# Patient Record
Sex: Male | Born: 1980 | Race: White | Hispanic: No | Marital: Single | Smoking: Current every day smoker
Health system: Southern US, Community
[De-identification: ages and names within clinical notes are randomized; demographics above are authoritative.]

---

## 2000-07-08 ENCOUNTER — Emergency Department (HOSPITAL_COMMUNITY): Admission: EM | Admit: 2000-07-08 | Discharge: 2000-07-08 | Payer: Self-pay | Admitting: Emergency Medicine

## 2000-10-05 ENCOUNTER — Emergency Department (HOSPITAL_COMMUNITY): Admission: EM | Admit: 2000-10-05 | Discharge: 2000-10-05 | Payer: Self-pay | Admitting: Emergency Medicine

## 2001-01-20 ENCOUNTER — Emergency Department (HOSPITAL_COMMUNITY): Admission: EM | Admit: 2001-01-20 | Discharge: 2001-01-20 | Payer: Self-pay | Admitting: Emergency Medicine

## 2002-10-23 ENCOUNTER — Emergency Department (HOSPITAL_COMMUNITY): Admission: EM | Admit: 2002-10-23 | Discharge: 2002-10-23 | Payer: Self-pay | Admitting: Emergency Medicine

## 2003-03-27 ENCOUNTER — Emergency Department (HOSPITAL_COMMUNITY): Admission: EM | Admit: 2003-03-27 | Discharge: 2003-03-27 | Payer: Self-pay | Admitting: Emergency Medicine

## 2003-04-08 ENCOUNTER — Emergency Department (HOSPITAL_COMMUNITY): Admission: EM | Admit: 2003-04-08 | Discharge: 2003-04-08 | Payer: Self-pay | Admitting: Emergency Medicine

## 2003-07-20 ENCOUNTER — Emergency Department (HOSPITAL_COMMUNITY): Admission: EM | Admit: 2003-07-20 | Discharge: 2003-07-20 | Payer: Self-pay | Admitting: Emergency Medicine

## 2003-10-31 ENCOUNTER — Emergency Department (HOSPITAL_COMMUNITY): Admission: EM | Admit: 2003-10-31 | Discharge: 2003-10-31 | Payer: Self-pay | Admitting: Emergency Medicine

## 2004-11-23 ENCOUNTER — Emergency Department (HOSPITAL_COMMUNITY): Admission: EM | Admit: 2004-11-23 | Discharge: 2004-11-23 | Payer: Self-pay | Admitting: Emergency Medicine

## 2006-02-14 ENCOUNTER — Emergency Department (HOSPITAL_COMMUNITY): Admission: EM | Admit: 2006-02-14 | Discharge: 2006-02-14 | Payer: Self-pay | Admitting: Emergency Medicine

## 2006-08-14 ENCOUNTER — Emergency Department (HOSPITAL_COMMUNITY): Admission: EM | Admit: 2006-08-14 | Discharge: 2006-08-14 | Payer: Self-pay | Admitting: Emergency Medicine

## 2006-11-27 ENCOUNTER — Emergency Department (HOSPITAL_COMMUNITY): Admission: EM | Admit: 2006-11-27 | Discharge: 2006-11-27 | Payer: Self-pay | Admitting: Emergency Medicine

## 2008-10-07 ENCOUNTER — Emergency Department (HOSPITAL_COMMUNITY): Admission: EM | Admit: 2008-10-07 | Discharge: 2008-10-07 | Payer: Self-pay | Admitting: Emergency Medicine

## 2009-10-07 ENCOUNTER — Emergency Department (HOSPITAL_COMMUNITY): Admission: EM | Admit: 2009-10-07 | Discharge: 2009-10-07 | Payer: Self-pay | Admitting: Emergency Medicine

## 2010-11-28 LAB — URINALYSIS, ROUTINE W REFLEX MICROSCOPIC
Bilirubin Urine: NEGATIVE
Glucose, UA: NEGATIVE mg/dL
Hgb urine dipstick: NEGATIVE
Ketones, ur: NEGATIVE mg/dL
Nitrite: NEGATIVE
Protein, ur: NEGATIVE mg/dL
Specific Gravity, Urine: 1.024 (ref 1.005–1.030)
Urobilinogen, UA: 1 mg/dL (ref 0.0–1.0)
pH: 7.5 (ref 5.0–8.0)

## 2010-11-28 LAB — URINE MICROSCOPIC-ADD ON

## 2019-02-02 ENCOUNTER — Encounter (HOSPITAL_COMMUNITY): Payer: Self-pay | Admitting: Emergency Medicine

## 2019-02-02 ENCOUNTER — Emergency Department (HOSPITAL_COMMUNITY)
Admission: EM | Admit: 2019-02-02 | Discharge: 2019-02-02 | Disposition: A | Discharge status: Home / Self Care | Payer: BLUE CROSS/BLUE SHIELD | Attending: Emergency Medicine | Admitting: Emergency Medicine

## 2019-02-02 ENCOUNTER — Other Ambulatory Visit: Payer: Self-pay

## 2019-02-02 DIAGNOSIS — F17200 Nicotine dependence, unspecified, uncomplicated: Secondary | ICD-10-CM | POA: Diagnosis not present

## 2019-02-02 DIAGNOSIS — L0211 Cutaneous abscess of neck: Secondary | ICD-10-CM | POA: Insufficient documentation

## 2019-02-02 DIAGNOSIS — L0291 Cutaneous abscess, unspecified: Secondary | ICD-10-CM

## 2019-02-02 LAB — CBG MONITORING, ED: Glucose-Capillary: 105 mg/dL — ABNORMAL HIGH (ref 70–99)

## 2019-02-02 MED ORDER — LIDOCAINE-EPINEPHRINE (PF) 2 %-1:200000 IJ SOLN
20.0000 mL | Freq: Once | INTRAMUSCULAR | Status: AC
  Administered 2019-02-02: 20 mL
  Filled 2019-02-02: qty 20

## 2019-02-02 MED ORDER — ACETAMINOPHEN 500 MG PO TABS: 1000.0000 mg | ORAL_TABLET | Freq: Once | ORAL | Status: DC

## 2019-02-02 MED ORDER — ACETAMINOPHEN 325 MG PO TABS
ORAL_TABLET | ORAL | Status: AC
  Filled 2019-02-02: qty 2

## 2019-02-02 MED ORDER — CEPHALEXIN 500 MG PO CAPS: 500.0000 mg | ORAL_CAPSULE | Freq: Four times a day (QID) | ORAL | 0 refills | Status: DC

## 2019-02-02 MED ORDER — ACETAMINOPHEN 325 MG PO TABS
650.0000 mg | ORAL_TABLET | Freq: Once | ORAL | Status: AC
  Administered 2019-02-02: 650 mg via ORAL

## 2019-02-02 MED ORDER — LIDOCAINE HCL (PF) 2 % IJ SOLN
INTRAMUSCULAR | Status: AC
  Filled 2019-02-02: qty 10

## 2019-02-02 MED ORDER — POVIDONE-IODINE 10 % EX SOLN
CUTANEOUS | Status: AC
  Administered 2019-02-02: 16:00:00
  Filled 2019-02-02: qty 30

## 2019-02-02 NOTE — ED Triage Notes (Signed)
Pt c/o of abscess on the back of his neck with swelling x 1 week

## 2019-02-02 NOTE — ED Provider Notes (Signed)
[facility] EMERGENCY DEPARTMENT Provider Note   CSN: [id number] Arrival date & time: 02/02/19  1529    History   Chief Complaint Chief Complaint  Patient presents with  . Abscess    HPI Joseph Ball is a 38 y.o. male.  He has no significant past medical history.  He is complaining of pain and swelling to the back of his neck that is been going on about 4 days getting progressively worse.  He thought it was an ingrown hair but seems to be getting much worse.  He said it drained a little bit of pus but he has not done anything to it.  Denies fevers.  Denies any history of diabetes.  No prior history of any abscesses.     The history is provided by the patient.  Abscess  Location:  Head/neck Head/neck abscess location: posterior neck. Size:  6 Abscess quality: induration, painful, redness and warmth   Abscess quality: not draining   Red streaking: no   Duration:  4 days Progression:  Worsening Pain details:    Quality:  Throbbing   Severity:  Severe   Timing:  Constant   Progression:  Worsening Chronicity:  New Context: not diabetes, not immunosuppression, not injected drug use, not insect bite/sting and not skin injury   Relieved by:  Nothing Worsened by:  Draining/squeezing Ineffective treatments:  None tried Associated symptoms: no fever, no nausea and no vomiting   Risk factors: no hx of MRSA and no prior abscess     History reviewed. No pertinent past medical history.  There are no active problems to display for this patient.   History reviewed. No pertinent surgical history.      Home Medications    Prior to Admission medications   Not on File    Family History History reviewed. No pertinent family history.  Social History Social History   Tobacco Use  . Smoking status: Current Every Day Smoker    Packs/day: 0.50  . Smokeless tobacco: Never Used  Substance Use Topics  . Alcohol use: Yes  . Drug use: Never     Allergies   Mushroom  extract complex and Shellfish allergy   Review of Systems Review of Systems  Constitutional: Negative for fever.  HENT: Negative for sore throat.   Respiratory: Negative for cough.   Gastrointestinal: Negative for nausea and vomiting.  Musculoskeletal: Positive for neck pain.  Skin: Positive for rash.  Neurological: Negative for speech difficulty.     Physical Exam Updated Vital Signs BP 132/84 (BP Location: Right Arm)   Pulse 96   Temp 98.2 F (36.8 C) (Oral)   Resp 18   Ht 6' (1.829 m)   Wt 113.4 kg   SpO2 99%   BMI 33.91 kg/m   Physical Exam Vitals signs and nursing note reviewed.  Constitutional:      Appearance: He is well-developed.  HENT:     Head: Normocephalic and atraumatic.  Eyes:     Conjunctiva/sclera: Conjunctivae normal.  Neck:     Musculoskeletal: Normal range of motion.     Comments: He is approximately 6 x 6 cm area of induration and tenderness with overlying erythema and a few small pustules on the posterior part of his neck at his hairline. Pulmonary:     Effort: Pulmonary effort is normal.  Skin:    General: Skin is warm and dry.     Capillary Refill: Capillary refill takes less than 2 seconds.     Findings:  Erythema and rash present.  Neurological:     General: No focal deficit present.     Mental Status: He is alert.     GCS: GCS eye subscore is 4. GCS verbal subscore is 5. GCS motor subscore is 6.      ED Treatments / Results  Labs (all labs ordered are listed, but only abnormal results are displayed) Labs Reviewed - No data to display  EKG None  Radiology No results found.  Procedures .Marland KitchenIncision and Drainage Date/Time: 02/02/2019 4:21 PM Performed by: Terrilee Files, MD Authorized by: Terrilee Files, MD   Consent:    Consent obtained:  Verbal   Consent given by:  Patient   Risks discussed:  Bleeding, incomplete drainage, pain and infection   Alternatives discussed:  No treatment, delayed treatment and referral  Location:    Type:  Abscess   Size:  6   Location:  Neck   Neck location:  R posterior Pre-procedure details:    Skin preparation:  Betadine Anesthesia (see MAR for exact dosages):    Anesthesia method:  Local infiltration   Local anesthetic:  Lidocaine 2% WITH epi Procedure type:    Complexity:  Simple Procedure details:    Incision types:  Single straight   Incision depth:  Subcutaneous   Scalpel blade:  15   Wound management:  Probed and deloculated   Drainage:  Purulent   Drainage amount:  Scant   Packing materials:  1/2 in iodoform gauze   Amount 1/2" iodoform:  6 Post-procedure details:    Patient tolerance of procedure:  Tolerated well, no immediate complications   (including critical care time)  Medications Ordered in ED Medications  lidocaine-EPINEPHrine (XYLOCAINE W/EPI) 2 %-1:200000 (PF) injection 20 mL (has no administration in time range)     Initial Impression / Assessment and Plan / ED Course  I have reviewed the triage vital signs and the nursing notes.  Pertinent labs & imaging results that were available during my care of the patient were reviewed by me and considered in my medical decision making (see chart for details).  Clinical Course as of Feb 02 1620  Sat Feb 02, 2019  [id number] 38 year old male with no stated past medical history history here with cellulitis/abscess on the back of his neck.  He has not had anything like this before.  Checked a fingerstick just to make sure he was in a new diabetic and it was only 105.  He is agreeable to an I&D of the area.   [MB]    Clinical Course User Index [MB] Terrilee Files, MD       Final Clinical Impressions(s) / ED Diagnoses   Final diagnoses:  Abscess    ED Discharge Orders         Ordered    cephALEXin (KEFLEX) 500 MG capsule  4 times daily     02/02/19 1620           Terrilee Files, MD 02/03/19 1219

## 2019-02-02 NOTE — Discharge Instructions (Addendum)
You were evaluated in the emergency department for swelling and pain in the back of your neck.  There was a abscess with a lot of induration that required incision and drainage.  We placed a packing that will need to be removed in 1 to 2 days.  The area will likely bleed a little bit and so you should keep it covered.  We are putting you on antibiotics for 7 days.  You should use Tylenol and ibuprofen for the pain.  Please return if any worsening symptoms.

## 2021-02-28 ENCOUNTER — Other Ambulatory Visit: Payer: Self-pay

## 2021-02-28 ENCOUNTER — Encounter (HOSPITAL_BASED_OUTPATIENT_CLINIC_OR_DEPARTMENT_OTHER): Payer: Self-pay | Admitting: Emergency Medicine

## 2021-02-28 ENCOUNTER — Emergency Department (HOSPITAL_BASED_OUTPATIENT_CLINIC_OR_DEPARTMENT_OTHER)
Admission: EM | Admit: 2021-02-28 | Discharge: 2021-02-28 | Disposition: A | Discharge status: Home / Self Care | Payer: BLUE CROSS/BLUE SHIELD | Attending: Emergency Medicine | Admitting: Emergency Medicine

## 2021-02-28 DIAGNOSIS — F17210 Nicotine dependence, cigarettes, uncomplicated: Secondary | ICD-10-CM | POA: Insufficient documentation

## 2021-02-28 DIAGNOSIS — L0291 Cutaneous abscess, unspecified: Secondary | ICD-10-CM

## 2021-02-28 DIAGNOSIS — L02213 Cutaneous abscess of chest wall: Secondary | ICD-10-CM | POA: Insufficient documentation

## 2021-02-28 MED ORDER — ACETAMINOPHEN 325 MG PO TABS
650.0000 mg | ORAL_TABLET | Freq: Once | ORAL | Status: AC
  Administered 2021-02-28: 650 mg via ORAL
  Filled 2021-02-28: qty 2

## 2021-02-28 MED ORDER — LIDOCAINE-EPINEPHRINE (PF) 2 %-1:200000 IJ SOLN
10.0000 mL | Freq: Once | INTRAMUSCULAR | Status: AC
  Administered 2021-02-28: 10 mL
  Filled 2021-02-28: qty 20

## 2021-02-28 MED ORDER — DOXYCYCLINE HYCLATE 100 MG PO CAPS: 100.0000 mg | ORAL_CAPSULE | Freq: Two times a day (BID) | ORAL | 0 refills | Status: AC

## 2021-02-28 MED ORDER — DOXYCYCLINE HYCLATE 100 MG PO TABS
100.0000 mg | ORAL_TABLET | Freq: Once | ORAL | Status: AC
  Administered 2021-02-28: 100 mg via ORAL
  Filled 2021-02-28: qty 1

## 2021-02-28 NOTE — Discharge Instructions (Addendum)
Please do warm compresses to your chest abscess and take antibiotics for the entire course.  Follow-up with your primary care provider in 2 days or to return the ER for reevaluation.  Please use Tylenol or ibuprofen for pain.  You may use 600 mg ibuprofen every 6 hours or 1000 mg of Tylenol every 6 hours.  You may choose to alternate between the 2.  This would be most effective.  Not to exceed 4 g of Tylenol within 24 hours.  Not to exceed 3200 mg ibuprofen 24 hours.

## 2021-02-28 NOTE — ED Triage Notes (Signed)
Pt arrives pov, ambulatory to triage, c/o abscess on L chest x 3 days. Redness and swelling noted. Pt denies fever.

## 2021-02-28 NOTE — ED Provider Notes (Signed)
[facility] EMERGENCY DEPARTMENT Provider Note   CSN: [id number] Arrival date & time: 02/28/21  1148     History Chief Complaint  Patient presents with   Abscess    Joseph Ball is a 40 y.o. male.  HPI Patient is 40 year old male presenting today with left-sided chest area of swelling warmth redness and tenderness to touch.  He states that it has been present for the past 3 days he states it has been progressively worsening denies any fevers chills nausea vomiting or systemic symptoms.  He states that it is achy and painful to touch.  Warm and tender.  Denies any other associate symptoms.  No aggravating mitigating factors.  No medications prior to arrival.  No other associated symptoms      History reviewed. No pertinent past medical history.  There are no problems to display for this patient.   History reviewed. No pertinent surgical history.     History reviewed. No pertinent family history.  Social History   Tobacco Use   Smoking status: Every Day    Packs/day: 0.50    Pack years: 0.00    Types: Cigarettes   Smokeless tobacco: Never  Substance Use Topics   Alcohol use: Not Currently   Drug use: Never    Home Medications Prior to Admission medications   Medication Sig Start Date End Date Taking? Authorizing Provider  doxycycline (VIBRAMYCIN) 100 MG capsule Take 1 capsule (100 mg total) by mouth 2 (two) times daily for 7 days. 02/28/21 03/07/21 Yes Zameria Vogl S, PA  cephALEXin (KEFLEX) 500 MG capsule Take 1 capsule (500 mg total) by mouth 4 (four) times daily. 02/02/19   Terrilee Files, MD    Allergies    Mushroom extract complex and Shellfish allergy  Review of Systems   Review of Systems  Constitutional:  Negative for fever.  HENT:  Negative for congestion.   Respiratory:  Negative for shortness of breath.   Cardiovascular:  Negative for chest pain.  Gastrointestinal:  Negative for abdominal distention.  Skin:  Positive for wound.        Abscess  Neurological:  Negative for dizziness and headaches.   Physical Exam Updated Vital Signs BP 113/72 (BP Location: Right Arm)   Pulse 67   Temp 98.1 F (36.7 C) (Oral)   Resp 16   Ht 6' (1.829 m)   Wt 122.5 kg   SpO2 100%   BMI 36.62 kg/m   Physical Exam Vitals and nursing note reviewed.  Constitutional:      General: He is not in acute distress.    Appearance: Normal appearance. He is not ill-appearing.  HENT:     Head: Normocephalic and atraumatic.  Eyes:     General: No scleral icterus.       Right eye: No discharge.        Left eye: No discharge.     Conjunctiva/sclera: Conjunctivae normal.  Pulmonary:     Effort: Pulmonary effort is normal.     Breath sounds: No stridor.  Skin:    General: Skin is warm and dry.     Comments: Patient with approximately 4 x 3 cm red indurated area with a head with no evidence of purulent drainage.  Bedside ultrasound confirmed fluid collection underneath.  Neurological:     Mental Status: He is alert and oriented to person, place, and time. Mental status is at baseline.    ED Results / Procedures / Treatments   Labs (all labs ordered  are listed, but only abnormal results are displayed) Labs Reviewed - No data to display  EKG None  Radiology No results found.  Procedures .Marland KitchenIncision and Drainage  Date/Time: 02/28/2021 2:54 PM Performed by: Gailen Shelter, PA Authorized by: Gailen Shelter, PA   Consent:    Consent obtained:  Verbal   Consent given by:  Patient   Risks discussed:  Bleeding, incomplete drainage, pain and damage to other organs   Alternatives discussed:  No treatment Universal protocol:    Procedure explained and questions answered to patient or proxy's satisfaction: yes     Relevant documents present and verified: yes     Test results available : yes     Imaging studies available: yes     Required blood products, implants, devices, and special equipment available: yes     Site/side marked:  yes     Immediately prior to procedure, a time out was called: yes     Patient identity confirmed:  Verbally with patient Location:    Type:  Abscess   Size:  4x3 cm   Location:  Trunk   Trunk location:  L breast Pre-procedure details:    Skin preparation:  Chlorhexidine Anesthesia:    Anesthesia method:  Local infiltration   Local anesthetic:  Lidocaine 2% WITH epi Procedure type:    Complexity:  Complex Procedure details:    Ultrasound guidance: yes     Incision types:  Cruciate   Incision depth:  Subcutaneous   Wound management:  Probed and deloculated, irrigated with saline and extensive cleaning   Drainage:  Purulent   Drainage amount:  Moderate Post-procedure details:    Procedure completion:  Tolerated well, no immediate complications   Medications Ordered in ED Medications  lidocaine-EPINEPHrine (XYLOCAINE W/EPI) 2 %-1:200000 (PF) injection 10 mL (10 mLs Infiltration Given by Other 02/28/21 1415)  acetaminophen (TYLENOL) tablet 650 mg (650 mg Oral Given 02/28/21 1414)  doxycycline (VIBRA-TABS) tablet 100 mg (100 mg Oral Given 02/28/21 1414)    ED Course  I have reviewed the triage vital signs and the nursing notes.  Pertinent labs & imaging results that were available during my care of the patient were reviewed by me and considered in my medical decision making (see chart for details).    MDM Rules/Calculators/A&P                          Patient with left chest wall abscess.  Incised and drained.  Field block was successful.  Tylenol and first dose of doxycycline given here in the ER.  Return precautions given we will follow-up in 2 days with urgent care, PCP or return to ER.  No systemic symptoms.  Abscess pocket not large enough to require packing. Moderate copious fluid was expressed.  Patient tolerated procedure well.  Final Clinical Impression(s) / ED Diagnoses Final diagnoses:  Abscess  Chest wall abscess    Rx / DC Orders ED Discharge Orders           Ordered    doxycycline (VIBRAMYCIN) 100 MG capsule  2 times daily        02/28/21 1451             Solon Augusta Parkman, [place] 02/28/21 1456    Gwyneth Sprout, MD 03/01/21 1455

## 2021-05-27 ENCOUNTER — Telehealth: Payer: Self-pay | Admitting: Physician Assistant

## 2021-05-27 DIAGNOSIS — K047 Periapical abscess without sinus: Secondary | ICD-10-CM

## 2021-05-27 MED ORDER — AMOXICILLIN-POT CLAVULANATE 875-125 MG PO TABS: 1.0000 | ORAL_TABLET | Freq: Two times a day (BID) | ORAL | 0 refills | Status: AC

## 2021-05-27 NOTE — Progress Notes (Signed)
Patient no-showed after 15 minutes of provider waiting in waiting room. Repeat links sent to email/number listed.

## 2021-05-27 NOTE — Progress Notes (Signed)
Virtual Visit Consent   Joseph Ball, you are scheduled for a virtual visit with a  provider today.     Just as with appointments in the office, your consent must be obtained to participate.  Your consent will be active for this visit and any virtual visit you may have with one of our providers in the next 365 days.     If you have a MyChart account, a copy of this consent can be sent to you electronically.  All virtual visits are billed to your insurance company just like a traditional visit in the office.    As this is a virtual visit, video technology does not allow for your provider to perform a traditional examination.  This may limit your provider's ability to fully assess your condition.  If your provider identifies any concerns that need to be evaluated in person or the need to arrange testing (such as labs, EKG, etc.), we will make arrangements to do so.     Although advances in technology are sophisticated, we cannot ensure that it will always work on either your end or our end.  If the connection with a video visit is poor, the visit may have to be switched to a telephone visit.  With either a video or telephone visit, we are not always able to ensure that we have a secure connection.     I need to obtain your verbal consent now.   Are you willing to proceed with your visit today?    Joseph Ball has provided verbal consent on 05/27/2021 for a virtual visit (video or telephone).   Piedad Climes, [place]   Date: 05/27/2021 5:27 PM   Virtual Visit via Video Note   I, Piedad Climes, connected with  Joseph Ball  ([id number], 01/30/1977) on 05/27/21 at  5:15 PM EDT by a video-enabled telemedicine application and verified that I am speaking with the correct person using two identifiers.  Location: Patient: Virtual Visit Location Patient: Home Provider: Virtual Visit Location Provider: Home Office   I discussed the limitations of evaluation and management by  telemedicine and the availability of in person appointments. The patient expressed understanding and agreed to proceed.    History of Present Illness: Joseph Ball is a 40 y.o. who identifies as a male who was assigned male at birth, and is being seen today for 1 day of upper R tooth /gum swelling with subsequent cheek swelling in that area. Denies fever, chills. Pain is mild-moderate. Denies decreased ROM of jaw. Chipped a tooth in this area recently adjacent to area of gingival swelling. Is without insurance (Medicaid pending) and does not have PCP or dental care. Marland Kitchen  HPI: HPI  Problems: There are no problems to display for this patient.   Allergies:  Allergies  Allergen Reactions   Fish Allergy Anaphylaxis   Shellfish Allergy Anaphylaxis   Mushroom Extract Complex     swelling   Medications:  Current Outpatient Medications:    amoxicillin-clavulanate (AUGMENTIN) 875-125 MG tablet, Take 1 tablet by mouth 2 (two) times daily., Disp: 14 tablet, Rfl: 0  Observations/Objective: Patient is well-developed, well-nourished in no acute distress.  Resting comfortably at home.  Head is normocephalic, atraumatic.  No labored breathing. Speech is clear and coherent with logical content.  Patient is alert and oriented at baseline.  R facial swelling -- mild, just overlying zygomatic bone without significant tenderness and without induration with patient palpation.  Can open mouth  Assessment and Plan: 1.  Dental abscess - amoxicillin-clavulanate (AUGMENTIN) 875-125 MG tablet; Take 1 tablet by mouth 2 (two) times daily.  Dispense: 14 tablet; Refill: 0 Afebrile. Normal ROM of jaw. Recent onset with history of chipped tooth. Supportive measures and OTC meds discussed. Rx Augmentin. Strict UC/ER precautions reviewed. Local resources reviewed.   Follow Up Instructions: I discussed the assessment and treatment plan with the patient. The patient was provided an opportunity to ask questions and all were  answered. The patient agreed with the plan and demonstrated an understanding of the instructions.  A copy of instructions were sent to the patient via MyChart.  The patient was advised to call back or seek an in-person evaluation if the symptoms worsen or if the condition fails to improve as anticipated.  Time:  I spent 15 minutes with the patient via telehealth technology discussing the above problems/concerns.    Piedad Climes, PA-C

## 2021-06-08 ENCOUNTER — Ambulatory Visit (HOSPITAL_COMMUNITY)
Admission: EM | Admit: 2021-06-08 | Discharge: 2021-06-08 | Disposition: A | Discharge status: Home / Self Care | Payer: Self-pay | Attending: Student | Admitting: Student

## 2021-06-08 ENCOUNTER — Other Ambulatory Visit: Payer: Self-pay

## 2021-06-08 ENCOUNTER — Ambulatory Visit (HOSPITAL_COMMUNITY): Admission: EM | Admit: 2021-06-08 | Payer: Self-pay | Source: Home / Self Care

## 2021-06-08 ENCOUNTER — Ambulatory Visit (INDEPENDENT_AMBULATORY_CARE_PROVIDER_SITE_OTHER): Payer: Self-pay

## 2021-06-08 ENCOUNTER — Encounter (HOSPITAL_COMMUNITY): Payer: Self-pay | Admitting: Emergency Medicine

## 2021-06-08 DIAGNOSIS — M79641 Pain in right hand: Secondary | ICD-10-CM

## 2021-06-08 DIAGNOSIS — W540XXA Bitten by dog, initial encounter: Secondary | ICD-10-CM

## 2021-06-08 MED ORDER — AMOXICILLIN-POT CLAVULANATE 875-125 MG PO TABS: 1.0000 | ORAL_TABLET | Freq: Two times a day (BID) | ORAL | 0 refills | Status: AC

## 2021-06-08 NOTE — Discharge Instructions (Addendum)
-  Start the antibiotic-Augmentin (amoxicillin-clavulanate), 1 pill every 12 hours for 7 days.  You can take this with food like with breakfast and dinner. -Wash your wound with gentle soap and water 1-2 times daily.  Let air dry or gently pat. You can follow with over-the-counter neosporin ointment (or similar). Keep wrapped during the day or when you're doing something that could get it dirty (working, [employer], cooking, Catering manager). Avoid cleansing with hydrogen peroxide or alcohol!! -Seek additional medical attention if the wound is getting worse instead of better- redness increasing in size, pain getting worse, new/worsening discharge, new fevers/chills, etc. -If symptoms persist in 3 days, follow-up with an orthopedist. I recommend [facility] at [address] [place], [place] [address]. You can schedule an appointment by calling [phone]) or online ([email or web address]), but they also have a walk-in clinic M-F 8a-8p and Sat 10a-3p.

## 2021-06-08 NOTE — ED Triage Notes (Signed)
Pt has a dog bite to his right hand x 4 days. Pt was trying to prevent his dog from attacking another dog. EMS dressed and assessed wound at the time of incident.

## 2021-06-08 NOTE — ED Provider Notes (Signed)
[facility]    CSN: [id number] Arrival date & time: 06/08/21  0947      History   Chief Complaint Chief Complaint  Patient presents with   Animal Bite    Right hand, Pt is up to date with tetanus. His dog has up to date vaccines    HPI DAYMON HORA is a 40 y.o. male presenting with dog bite right hand sustained 4 days ago.  Medical history noncontributory.  This patient states that he was at a fair when another dog lunged at his dog, he jumped onto his own dog to prevent the other dog from biting it, his own dog latched onto his right hand.  Patient states that it was a soft bite, EMTs evaluated at the scene and said it was a superficial wound.  He is presenting today because he is concerned that his hand is "broken".  Requesting an x-ray of the right hand as he is having difficulty moving his third and fourth fingers and pain at the MCP joint.  States he been cleaning the lacerations with alcohol and they seem to be healing well, does not think he needs an antibiotic.  Some tingling over dorsum of the third and fourth MCP bones. He is right handed.   HPI  No past medical history on file.  There are no problems to display for this patient.   History reviewed. No pertinent surgical history.     Home Medications    Prior to Admission medications   Medication Sig Start Date End Date Taking? Authorizing Provider  amoxicillin-clavulanate (AUGMENTIN) 875-125 MG tablet Take 1 tablet by mouth every 12 (twelve) hours. 06/08/21  Yes Rhys Martini, PA-C    Family History History reviewed. No pertinent family history.  Social History Social History   Tobacco Use   Smoking status: Every Day    Packs/day: 0.50    Types: Cigarettes   Smokeless tobacco: Never  Vaping Use   Vaping Use: Every day  Substance Use Topics   Alcohol use: Not Currently   Drug use: Never     Allergies   Mushroom extract complex and Shellfish allergy   Review of Systems Review of  Systems  Musculoskeletal:        R hand pain  All other systems reviewed and are negative.   Physical Exam Triage Vital Signs ED Triage Vitals  Enc Vitals Group     BP 06/08/21 1032 113/79     Pulse Rate 06/08/21 1032 60     Resp --      Temp 06/08/21 1032 98.2 F (36.8 C)     Temp Source 06/08/21 1032 Oral     SpO2 06/08/21 1032 96 %     Weight --      Height --      Head Circumference --      Peak Flow --      Pain Score 06/08/21 1033 10     Pain Loc --      Pain Edu? --      Excl. in GC? --    No data found.  Updated Vital Signs BP 113/79 (BP Location: Left Arm)   Pulse 60   Temp 98.2 F (36.8 C) (Oral)   SpO2 96%   Visual Acuity Right Eye Distance:   Left Eye Distance:   Bilateral Distance:    Right Eye Near:   Left Eye Near:    Bilateral Near:     Physical Exam Vitals  reviewed.  Constitutional:      General: He is not in acute distress.    Appearance: Normal appearance. He is not ill-appearing or diaphoretic.  HENT:     Head: Normocephalic and atraumatic.  Cardiovascular:     Rate and Rhythm: Normal rate and regular rhythm.     Heart sounds: Normal heart sounds.  Pulmonary:     Effort: Pulmonary effort is normal.     Breath sounds: Normal breath sounds.  Musculoskeletal:     Comments: See images below. Few well healed lacerations dorsum R hand. ROM 3rd and 4th fingers decreased and tender over MCP joints. Sensation intact. Aside from lacerations no obvious skin changes effusion or bony deformity. Grip strength 3/5, sensation intact, cap refill < 2 seconds, radial pulse 2+. No other lacerations or injuries.  Skin:    General: Skin is warm.  Neurological:     General: No focal deficit present.     Mental Status: He is alert and oriented to person, place, and time.  Psychiatric:        Mood and Affect: Mood normal.        Behavior: Behavior normal.        Thought Content: Thought content normal.        Judgment: Judgment normal.            UC Treatments / Results  Labs (all labs ordered are listed, but only abnormal results are displayed) Labs Reviewed - No data to display  EKG   Radiology DG Hand Complete Right  Result Date: 06/08/2021 CLINICAL DATA:  Right hand pain after dog bite. EXAM: RIGHT HAND - COMPLETE 3+ VIEW COMPARISON:  None. FINDINGS: There is no evidence of fracture or dislocation. There is no evidence of arthropathy or other focal bone abnormality. Soft tissues are unremarkable. IMPRESSION: Negative. Electronically Signed   By: Lupita Raider M.D.   On: 06/08/2021 11:58    Procedures Procedures (including critical care time)  Medications Ordered in UC Medications - No data to display  Initial Impression / Assessment and Plan / UC Course  I have reviewed the triage vital signs and the nursing notes.  Pertinent labs & imaging results that were available during my care of the patient were reviewed by me and considered in my medical decision making (see chart for details).     This patient is a very pleasant 40 y.o. year old male presenting with R hand contusion following dog bite. Neurovascularly intact.   Xray R hand negative. Tdap UTD 2020 per pt.  Dog was pt's own and UTD on vaccines.  Augmentin sent as below.   Wrist brace provided at patient request.  Discussed that for pain management, he should start with Tylenol and ibuprofen, as he has not tried any medications for the symptoms. He is in agreement.  F/u with [facility] if symptoms persist. ED return precautions discussed. Patient verbalizes understanding and agreement.   Final Clinical Impressions(s) / UC Diagnoses   Final diagnoses:  Right hand pain  Dog bite, initial encounter     Discharge Instructions      -Start the antibiotic-Augmentin (amoxicillin-clavulanate), 1 pill every 12 hours for 7 days.  You can take this with food like with breakfast and dinner. -Wash your wound with gentle soap and water 1-2  times daily.  Let air dry or gently pat. You can follow with over-the-counter neosporin ointment (or similar). Keep wrapped during the day or when you're doing something that could get it  dirty (working, Presenter, broadcasting, cooking, Catering manager). Avoid cleansing with hydrogen peroxide or alcohol!! -Seek additional medical attention if the wound is getting worse instead of better- redness increasing in size, pain getting worse, new/worsening discharge, new fevers/chills, etc. -If symptoms persist in 3 days, follow-up with an orthopedist. I recommend [facility] at [address], [place], [place] [address]. You can schedule an appointment by calling [phone]) or online ([email or web address]), but they also have a walk-in clinic M-F 8a-8p and Sat 10a-3p.      ED Prescriptions     Medication Sig Dispense Auth. Provider   amoxicillin-clavulanate (AUGMENTIN) 875-125 MG tablet Take 1 tablet by mouth every 12 (twelve) hours. 14 tablet Rhys Martini, PA-C      PDMP not reviewed this encounter.   Rhys Martini, PA-C 06/08/21 1313

## 2021-10-31 ENCOUNTER — Telehealth: Payer: 59 | Admitting: Family

## 2021-10-31 ENCOUNTER — Telehealth: Payer: Self-pay

## 2021-10-31 DIAGNOSIS — S30811A Abrasion of abdominal wall, initial encounter: Secondary | ICD-10-CM

## 2021-10-31 DIAGNOSIS — L089 Local infection of the skin and subcutaneous tissue, unspecified: Secondary | ICD-10-CM

## 2021-10-31 MED ORDER — SULFAMETHOXAZOLE-TRIMETHOPRIM 800-160 MG PO TABS: 1.0000 | ORAL_TABLET | Freq: Two times a day (BID) | ORAL | 0 refills | Status: AC

## 2021-10-31 MED ORDER — MUPIROCIN CALCIUM 2 % EX CREA: 1.0000 "application " | TOPICAL_CREAM | Freq: Two times a day (BID) | CUTANEOUS | 0 refills | Status: AC

## 2021-10-31 NOTE — Progress Notes (Signed)
Virtual Visit Consent   Joseph Ball, you are scheduled for a virtual visit with a [facility] provider today.     Just as with appointments in the office, your consent must be obtained to participate.  Your consent will be active for this visit and any virtual visit you may have with one of our providers in the next 365 days.     If you have a MyChart account, a copy of this consent can be sent to you electronically.  All virtual visits are billed to your insurance company just like a traditional visit in the office.    As this is a virtual visit, video technology does not allow for your provider to perform a traditional examination.  This may limit your provider's ability to fully assess your condition.  If your provider identifies any concerns that need to be evaluated in person or the need to arrange testing (such as labs, EKG, etc.), we will make arrangements to do so.     Although advances in technology are sophisticated, we cannot ensure that it will always work on either your end or our end.  If the connection with a video visit is poor, the visit may have to be switched to a telephone visit.  With either a video or telephone visit, we are not always able to ensure that we have a secure connection.     I need to obtain your verbal consent now.   Are you willing to proceed with your visit today?    [name] has provided verbal consent on 10/31/2021 for a virtual visit (video or telephone).   [name], FNP   Date: 10/31/2021 4:37 PM   Virtual Visit via Video Note   I, [name], connected with  Joseph Ball  ([id number], 1980-10-13) on 10/31/21 at  4:30 PM EST by a video-enabled telemedicine application and verified that I am speaking with the correct person using two identifiers.  Location: Patient: Virtual Visit Location Patient: Home Provider: Virtual Visit Location Provider: Home Office   I discussed the limitations of evaluation and management by telemedicine and  the availability of in person appointments. The patient expressed understanding and agreed to proceed.    History of Present Illness: Joseph Ball is a 41 y.o. who identifies as a male who was assigned male at birth, and is being seen today for an infection on his right side. States he and his partner was "horse playing" and her nail scratched his side three days ago. States mild bleeding, but it is swollen and draining a yellow discharge.   HPI: HPI  Problems: There are no problems to display for this patient.   Allergies:  Allergies  Allergen Reactions   Fish Allergy Anaphylaxis   Mushroom Extract Complex Anaphylaxis   Shellfish Allergy Anaphylaxis   Mushroom Extract Complex     swelling   Medications:  Current Outpatient Medications:    mupirocin cream (BACTROBAN) 2 %, Apply 1 application topically 2 (two) times daily., Disp: 15 g, Rfl: 0   sulfamethoxazole-trimethoprim (BACTRIM DS) 800-160 MG tablet, Take 1 tablet by mouth 2 (two) times daily., Disp: 14 tablet, Rfl: 0   amoxicillin-clavulanate (AUGMENTIN) 875-125 MG tablet, Take 1 tablet by mouth every 12 (twelve) hours., Disp: 14 tablet, Rfl: 0   amoxicillin-clavulanate (AUGMENTIN) 875-125 MG tablet, Take 1 tablet by mouth 2 (two) times daily., Disp: 14 tablet, Rfl: 0  Observations/Objective: Patient is well-developed, well-nourished in no acute distress.  Resting comfortably  at home.  Head is normocephalic, atraumatic.  No labored breathing.  Speech is clear and coherent with logical content.  Patient is alert and oriented at baseline.  Right raised area approx 4 cm that is erythemas, no discharge noted   Assessment and Plan: 1. Infected abrasion of flank, initial encounter - sulfamethoxazole-trimethoprim (BACTRIM DS) 800-160 MG tablet; Take 1 tablet by mouth 2 (two) times daily.  Dispense: 14 tablet; Refill: 0 - mupirocin cream (BACTROBAN) 2 %; Apply 1 application topically 2 (two) times daily.  Dispense: 15 g; Refill:  0  Keep clean and dry Warm compresses  If redness, fever, or drainage worsen need to be seen face to face  Follow Up Instructions: I discussed the assessment and treatment plan with the patient. The patient was provided an opportunity to ask questions and all were answered. The patient agreed with the plan and demonstrated an understanding of the instructions.  A copy of instructions were sent to the patient via MyChart unless otherwise noted below.     The patient was advised to call back or seek an in-person evaluation if the symptoms worsen or if the condition fails to improve as anticipated.  Time:  I spent 9 minutes with the patient via telehealth technology discussing the above problems/concerns.    [name], FNP

## 2021-10-31 NOTE — Progress Notes (Deleted)
The patient no-showed for appointment despite this provider sending direct link, reaching out via phone with no response and waiting for at least 10 minutes from appointment time for patient to join. They will be marked as a NS for this appointment/time. 3 phone call attempts were made trying to contact the patient.   Abran Cantor, NP

## 2021-11-01 ENCOUNTER — Ambulatory Visit (INDEPENDENT_AMBULATORY_CARE_PROVIDER_SITE_OTHER): Payer: 59 | Admitting: Orthopaedic Surgery

## 2021-11-01 ENCOUNTER — Other Ambulatory Visit: Payer: Self-pay

## 2021-11-01 ENCOUNTER — Other Ambulatory Visit (HOSPITAL_BASED_OUTPATIENT_CLINIC_OR_DEPARTMENT_OTHER): Payer: Self-pay | Admitting: Orthopaedic Surgery

## 2021-11-01 ENCOUNTER — Ambulatory Visit (HOSPITAL_BASED_OUTPATIENT_CLINIC_OR_DEPARTMENT_OTHER)
Admission: RE | Admit: 2021-11-01 | Discharge: 2021-11-01 | Disposition: A | Discharge status: Home / Self Care | Payer: 59 | Source: Ambulatory Visit | Attending: Orthopaedic Surgery | Admitting: Orthopaedic Surgery

## 2021-11-01 DIAGNOSIS — M25561 Pain in right knee: Secondary | ICD-10-CM | POA: Insufficient documentation

## 2021-11-01 DIAGNOSIS — M545 Low back pain, unspecified: Secondary | ICD-10-CM

## 2021-11-01 DIAGNOSIS — M25559 Pain in unspecified hip: Secondary | ICD-10-CM

## 2021-11-01 DIAGNOSIS — G8929 Other chronic pain: Secondary | ICD-10-CM

## 2021-11-01 NOTE — Addendum Note (Signed)
Addended by: Rip Harbour on: 11/01/2021 01:22 PM   Modules accepted: Orders
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
## 2021-11-03 ENCOUNTER — Ambulatory Visit (HOSPITAL_BASED_OUTPATIENT_CLINIC_OR_DEPARTMENT_OTHER): Payer: Self-pay | Admitting: Orthopaedic Surgery

## 2021-11-18 ENCOUNTER — Ambulatory Visit (HOSPITAL_COMMUNITY): Admission: RE | Admit: 2021-11-18 | Payer: 59 | Source: Ambulatory Visit

## 2021-11-29 ENCOUNTER — Ambulatory Visit (HOSPITAL_BASED_OUTPATIENT_CLINIC_OR_DEPARTMENT_OTHER): Payer: 59 | Admitting: Orthopaedic Surgery

## 2023-06-30 ENCOUNTER — Ambulatory Visit (HOSPITAL_BASED_OUTPATIENT_CLINIC_OR_DEPARTMENT_OTHER): Payer: Self-pay | Admitting: Orthopaedic Surgery

## 2023-07-03 ENCOUNTER — Ambulatory Visit (INDEPENDENT_AMBULATORY_CARE_PROVIDER_SITE_OTHER): Payer: 59 | Admitting: Orthopaedic Surgery

## 2023-07-03 ENCOUNTER — Ambulatory Visit (HOSPITAL_BASED_OUTPATIENT_CLINIC_OR_DEPARTMENT_OTHER)
Admission: RE | Admit: 2023-07-03 | Discharge: 2023-07-03 | Disposition: A | Discharge status: Home / Self Care | Payer: 59 | Source: Ambulatory Visit | Attending: Orthopaedic Surgery | Admitting: Orthopaedic Surgery

## 2023-07-03 DIAGNOSIS — M79641 Pain in right hand: Secondary | ICD-10-CM
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
## 2023-07-05 ENCOUNTER — Telehealth: Payer: Self-pay | Admitting: Orthopedic Surgery

## 2023-07-10 ENCOUNTER — Ambulatory Visit (INDEPENDENT_AMBULATORY_CARE_PROVIDER_SITE_OTHER): Payer: 59 | Admitting: Orthopedic Surgery

## 2023-07-10 ENCOUNTER — Encounter: Payer: 59 | Admitting: Rehabilitative and Restorative Service Providers"

## 2023-07-10 DIAGNOSIS — M79641 Pain in right hand: Secondary | ICD-10-CM

## 2023-07-10 NOTE — Progress Notes (Signed)
[name] - 42 y.o. male MRN [id number]  Date of birth: 1980/12/24  Office Visit Note: Visit Date: 07/10/2023 PCP: Patient, No Pcp Per Referred by: Huel Cote, MD  Subjective: No chief complaint on file.  HPI: Joseph Ball is a pleasant 42 y.o. male who presents today for evaluation of right hand numbness and tingling in the ulnar-sided digits with associated progressive contracture of the small finger.  Had an injury approximately 3 weeks prior, hit the dorsal aspect of his hand, he thinks at the long finger region.  Was seen recently by Dr. Steward Drone who referred him to me for workup, has an MRI pending of the right hand for possible central slip injury.  He states that he did have a laceration injury to the right wrist on the volar ulnar aspect years prior which underwent bedside irrigation with closure, there was concern at that time for a deeper injury which was not investigated.  Pertinent ROS were reviewed with the patient and found to be negative unless otherwise specified above in HPI.   Visit Reason: right hand Duration of symptoms: 21 days Hand dominance: right Occupation: [employer]  Diabetic: No Smoking: Yes Heart/Lung History: none Blood Thinners:  none  Prior Testing/EMG: xray 07/03/23 Injections (Date): none Treatments: none Prior Surgery: none  Assessment & Plan: Visit Diagnoses:  1. Pain in right hand     Plan: Extensive discussion was had the patient today regarding his right hand and upper extremity complaints.  For the numbness and tingling throughout the right hand, in the ulnar-sided digits, I recommended he undergo right upper extremity EMG to better delineate any potential nerve pathology.  For the small finger, he does have a significant contracture of the small finger, which is passively correctable today.  I am in agreement that there is concern for potential central slip injury based on his examination today, I have arranged for him to get  in with occupational therapy for fabrication of an orthosis for the right hand and small finger.  I am in agreement that he should obtain the MRI study as planned of the right hand as well.  He should follow-up with me after the MRI of the hand and the upper extremity EMG is complete to review results and discuss further treatment.  Extensive discussion was had with the occupational therapist today as well who will be treating him.  Greater than 45 minutes was spent with the care of this patient.  Follow-up: No follow-ups on file.   Meds & Orders: No orders of the defined types were placed in this encounter.   Orders Placed This Encounter  Procedures   Ambulatory referral to Occupational Therapy   Ambulatory referral to Physical Medicine Rehab     Procedures: No procedures performed      Clinical History: No specialty comments available.  He reports that he has been smoking cigarettes. He has never used smokeless tobacco. No results for input(s): "HGBA1C", "LABURIC" in the last 8760 hours.  Objective:   Vital Signs: There were no vitals taken for this visit.  Physical Exam  Gen: Well-appearing, in no acute distress; non-toxic CV: Regular Rate. Well-perfused. Warm.  Resp: Breathing unlabored on room air; no wheezing. Psych: Fluid speech in conversation; appropriate affect; normal thought process  Ortho Exam PHYSICAL EXAM:  General: Patient is well appearing and in no distress.   Range of Motion and Palpation Tests: Mobility is full about the elbows with flexion and extension.  Forearm supination  and pronation are 85/85 bilaterally.  Wrist flexion/extension is 75/65 bilaterally.   Right small finger held in a contracted position, there is hyperextension at the MP with flexion at the PIP and DIP.  With correction of the MP, I am able to extend the PIP and DIP passively with resistance.  Elson test of the right small finger concerning for potential central slip  injury  Neurologic, Vascular, Motor: Sensation is diminished in the ulnar nerve distribution of the right hand both volarly and dorsally.  2-point discrimination 8 mm in the ulnar nerve distribution, 5 mm in the median nerve distribution.  Positive scratch collapse test to the right wrist and elbow  Fingers pink and well perfused.  Capillary refill is brisk.      No results found for: "HGBA1C"   Imaging: No results found.  Past Medical/Family/Surgical/Social History: Medications & Allergies reviewed per EMR, new medications updated. There are no problems to display for this patient.  No past medical history on file. No family history on file. No past surgical history on file. Social History   Occupational History   Not on file  Tobacco Use   Smoking status: Every Day    Current packs/day: 0.50    Types: Cigarettes   Smokeless tobacco: Never  Vaping Use   Vaping status: Every Day  Substance and Sexual Activity   Alcohol use: Not Currently   Drug use: Never   Sexual activity: Not on file    Joseph Ball Joseph Ball) Joseph Ball, M.D.  [facility] 1:06 PM
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
## 2023-07-12 ENCOUNTER — Encounter: Payer: Self-pay | Admitting: Rehabilitative and Restorative Service Providers"

## 2023-07-12 ENCOUNTER — Other Ambulatory Visit: Payer: Self-pay

## 2023-07-12 ENCOUNTER — Ambulatory Visit (INDEPENDENT_AMBULATORY_CARE_PROVIDER_SITE_OTHER): Payer: 59 | Admitting: Rehabilitative and Restorative Service Providers"

## 2023-07-12 ENCOUNTER — Encounter: Payer: 59 | Admitting: Rehabilitative and Restorative Service Providers"

## 2023-07-12 DIAGNOSIS — M25641 Stiffness of right hand, not elsewhere classified: Secondary | ICD-10-CM | POA: Diagnosis not present

## 2023-07-12 DIAGNOSIS — M6281 Muscle weakness (generalized): Secondary | ICD-10-CM | POA: Diagnosis not present

## 2023-07-12 DIAGNOSIS — R278 Other lack of coordination: Secondary | ICD-10-CM | POA: Diagnosis not present

## 2023-07-12 DIAGNOSIS — M79641 Pain in right hand: Secondary | ICD-10-CM | POA: Diagnosis not present

## 2023-07-12 DIAGNOSIS — R202 Paresthesia of skin: Secondary | ICD-10-CM

## 2023-07-18 ENCOUNTER — Encounter: Payer: Self-pay | Admitting: *Deleted

## 2023-08-08 ENCOUNTER — Encounter: Payer: 59 | Admitting: Physical Medicine and Rehabilitation

## 2023-11-14 ENCOUNTER — Ambulatory Visit
Admission: EM | Admit: 2023-11-14 | Discharge: 2023-11-14 | Disposition: A | Discharge status: Home / Self Care | Payer: Self-pay | Attending: Family Medicine | Admitting: Family Medicine

## 2023-11-14 DIAGNOSIS — M7071 Other bursitis of hip, right hip: Secondary | ICD-10-CM

## 2023-11-14 MED ORDER — LIDOCAINE 5 % EX PTCH: 1.0000 | MEDICATED_PATCH | CUTANEOUS | 0 refills | Status: AC

## 2023-11-14 MED ORDER — PREDNISONE 10 MG (21) PO TBPK: ORAL_TABLET | Freq: Every day | ORAL | 0 refills | Status: AC

## 2023-11-14 MED ORDER — LIDOCAINE 5 % EX PTCH: 1.0000 | MEDICATED_PATCH | CUTANEOUS | 0 refills | Status: DC

## 2023-11-14 NOTE — Discharge Instructions (Addendum)
 Start prednisone taper as prescribed.  You may use a Lidoderm patch to right hip for 12 hours and remove for 12 hours.  We do heat and rest.  Please follow-up with your PCP if your symptoms do not improve.  Please go to the ER for any worsening symptoms.  Hope you feel better soon!

## 2023-11-14 NOTE — ED Provider Notes (Addendum)
 [facility]    CSN: [id number] Arrival date & time: 11/14/23  0909      History   Chief Complaint Chief Complaint  Patient presents with   Hip Pain    HPI Joseph Ball is a 43 y.o. male presents for hip pain.  Patient reports 2 days of a persistent right lateral hip pain that is worse with weightbearing.  Denies any injury such as fall but does state he does "a lot" of walking.  States he feels a bump on his hip.  No numbness/tingling/weakness of the leg.  No back pain.  No history of hip injuries/surgeries/fractures in the past.  He did a topical Biofreeze with minimal improvement.  No other concerns at this time.   Hip Pain    History reviewed. No pertinent past medical history.  There are no active problems to display for this patient.   History reviewed. No pertinent surgical history.     Home Medications    Prior to Admission medications   Medication Sig Start Date End Date Taking? Authorizing Provider  predniSONE (STERAPRED UNI-PAK 21 TAB) 10 MG (21) TBPK tablet Take by mouth daily. Take 6 tabs by mouth daily  for 2 days, then 5 tabs for 2 days, then 4 tabs for 2 days, then 3 tabs for 2 days, 2 tabs for 2 days, then 1 tab by mouth daily for 2 days 11/14/23  Yes Radford Pax, NP  amoxicillin-clavulanate (AUGMENTIN) 875-125 MG tablet Take 1 tablet by mouth every 12 (twelve) hours. 06/08/21   Rhys Martini, PA-C  amoxicillin-clavulanate (AUGMENTIN) 875-125 MG tablet Take 1 tablet by mouth 2 (two) times daily. 05/27/21   Waldon Merl, PA-C  lidocaine (LIDODERM) 5 % Place 1 patch onto the skin daily. Place on your right hip for 12 hours and remove it for 12 hours 11/14/23   Radford Pax, NP  mupirocin cream (BACTROBAN) 2 % Apply 1 application topically 2 (two) times daily. 10/31/21   Junie Spencer, FNP  sulfamethoxazole-trimethoprim (BACTRIM DS) 800-160 MG tablet Take 1 tablet by mouth 2 (two) times daily. 10/31/21   Junie Spencer, FNP    Family  History History reviewed. No pertinent family history.  Social History Social History   Tobacco Use   Smoking status: Every Day    Current packs/day: 0.50    Types: Cigarettes   Smokeless tobacco: Never  Vaping Use   Vaping status: Every Day  Substance Use Topics   Alcohol use: Not Currently   Drug use: Never     Allergies   Fish allergy, Mushroom extract complex (obsolete), Shellfish allergy, and Mushroom extract complex (obsolete)   Review of Systems Review of Systems  Musculoskeletal:        Right hip pain     Physical Exam Triage Vital Signs ED Triage Vitals [11/14/23 0923]  Encounter Vitals Group     BP 126/82     Systolic BP Percentile      Diastolic BP Percentile      Pulse Rate 69     Resp 16     Temp 97.8 F (36.6 C)     Temp Source Oral     SpO2 98 %     Weight      Height      Head Circumference      Peak Flow      Pain Score 8     Pain Loc      Pain Education  Exclude from Growth Chart    No data found.  Updated Vital Signs BP 126/82 (BP Location: Right Arm)   Pulse 69   Temp 97.8 F (36.6 C) (Oral)   Resp 16   SpO2 98%   Visual Acuity Right Eye Distance:   Left Eye Distance:   Bilateral Distance:    Right Eye Near:   Left Eye Near:    Bilateral Near:     Physical Exam Vitals and nursing note reviewed.  Constitutional:      General: He is not in acute distress.    Appearance: Normal appearance. He is not ill-appearing.  HENT:     Head: Normocephalic and atraumatic.  Eyes:     Conjunctiva/sclera: Conjunctivae normal.  Cardiovascular:     Rate and Rhythm: Normal rate.  Pulmonary:     Effort: Pulmonary effort is normal.  Musculoskeletal:     Right hip: Tenderness present. No deformity, lacerations, bony tenderness or crepitus. Decreased range of motion. Normal strength.     Comments: Pain with internal/external rotation of the hip.  There is no bruising, swelling, nodules  Skin:    General: Skin is warm and dry.   Neurological:     General: No focal deficit present.     Mental Status: He is alert and oriented to person, place, and time.  Psychiatric:        Mood and Affect: Mood normal.        Behavior: Behavior normal.      UC Treatments / Results  Labs (all labs ordered are listed, but only abnormal results are displayed) Labs Reviewed - No data to display   Comprehensive Metabolic Panel Order: [id number] Component Ref Range & Units 1 yr ago  Na 136 - 146 mmol/L 136  Potassium 3.7 - 5.4 mmol/L 3.8  Cl 97 - 108 mmol/L 105  CO2 20 - 32 mmol/L 24  AGAP 7 - 16 mmol/L 7  Glucose 65 - 99 mg/dL 94  BUN 6 - 24 mg/dL 13  Creatinine 8.65 - 7.84 mg/dL 6.96  Ca 8.7 - 29.5 mg/dL 8.9  ALK PHOS 25 - 284 U/L 66  T Bili 0.00 - 1.20 mg/dL 1.32  Total Protein 6.0 - 8.5 gm/dL 6.2  Alb 3.5 - 5.5 gm/dL 3.6  GLOBULIN 1.5 - 4.5 gm/dL 2.6  ALBUMIN/GLOBULIN RATIO 1.1 - 2.5 1.4  BUN/CREAT RATIO 11.0 - 26.0 11.1  ALT 0 - 55 U/L 16  AST 0 - 40 U/L 18  eGFR mL/min/1.47m2 80  Comment: Normal GFR (glomerular filtration rate) > 60 mL/min/1.73 meters squared, < 60 may include impaired kidney function. Calculation based on the Chronic Kidney Disease Epidemiology Collaboration (CK-EPI)equation refit without adjustment for race.  Resulting Agency [facility]   Specimen Collected: 06/05/22 21:59   Performed by: [facility] Last Resulted: 06/05/22 22:45   EKG   Radiology No results found.  Procedures Procedures (including critical care time)  Medications Ordered in UC Medications - No data to display  Initial Impression / Assessment and Plan / UC Course  I have reviewed the triage vital signs and the nursing notes.  Pertinent labs & imaging results that were available during my care of the patient were reviewed by me and considered in my medical decision making (see chart for details).     Reviewed exam and symptoms with patient.  No red flags.   Discussed likely bursitis.  Will do trial of prednisone taper and Lidoderm patch.  Discussed heat and rest.  PCP follow-up if symptoms do not improve.  ER precautions reviewed and patient verbalized understanding. Final Clinical Impressions(s) / UC Diagnoses   Final diagnoses:  Bursitis of right hip, unspecified bursa     Discharge Instructions      Start prednisone taper as prescribed.  You may use a Lidoderm patch to right hip for 12 hours and remove for 12 hours.  We do heat and rest.  Please follow-up with your PCP if your symptoms do not improve.  Please go to the ER for any worsening symptoms.  Hope you feel better soon!    ED Prescriptions     Medication Sig Dispense Auth. Provider   lidocaine (LIDODERM) 5 %  (Status: Discontinued) Place 1 patch onto the skin daily. Is on your right hip for 12 hours and remove it for 12 hours 10 patch Radford Pax, NP   predniSONE (STERAPRED UNI-PAK 21 TAB) 10 MG (21) TBPK tablet Take by mouth daily. Take 6 tabs by mouth daily  for 2 days, then 5 tabs for 2 days, then 4 tabs for 2 days, then 3 tabs for 2 days, 2 tabs for 2 days, then 1 tab by mouth daily for 2 days 42 tablet Radford Pax, NP   lidocaine (LIDODERM) 5 % Place 1 patch onto the skin daily. Place on your right hip for 12 hours and remove it for 12 hours 10 patch Radford Pax, NP      PDMP not reviewed this encounter.   Radford Pax, NP 11/14/23 3086    Radford Pax, NP 11/14/23 [phone]

## 2023-11-14 NOTE — ED Triage Notes (Signed)
 Pt presents with c/o a knot on the rt hip. States it has been hurting for 2 days. Pt states it is a constant beating pain.
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
## 2024-04-29 ENCOUNTER — Other Ambulatory Visit: Payer: Self-pay | Admitting: Ophthalmology

## 2024-04-29 DIAGNOSIS — H4710 Unspecified papilledema: Secondary | ICD-10-CM

## 2024-04-30 ENCOUNTER — Other Ambulatory Visit: Payer: Self-pay | Admitting: Ophthalmology

## 2024-04-30 DIAGNOSIS — H4710 Unspecified papilledema: Secondary | ICD-10-CM

## 2024-05-14 ENCOUNTER — Other Ambulatory Visit
